# Patient Record
Sex: Female | Born: 1995 | Hispanic: Yes | Marital: Single | Smoking: Never smoker
Health system: Southern US, Community
[De-identification: ages and names within clinical notes are randomized; demographics above are authoritative.]

## PROBLEM LIST (undated history)

## (undated) DIAGNOSIS — K589 Irritable bowel syndrome without diarrhea: Secondary | ICD-10-CM

## (undated) DIAGNOSIS — G43909 Migraine, unspecified, not intractable, without status migrainosus: Secondary | ICD-10-CM

## (undated) DIAGNOSIS — J45909 Unspecified asthma, uncomplicated: Secondary | ICD-10-CM

## (undated) DIAGNOSIS — T753XXA Motion sickness, initial encounter: Secondary | ICD-10-CM

---
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
## 2018-09-11 ENCOUNTER — Other Ambulatory Visit: Payer: Self-pay

## 2018-09-14 ENCOUNTER — Telehealth (INDEPENDENT_AMBULATORY_CARE_PROVIDER_SITE_OTHER): Payer: BC Managed Care – PPO | Admitting: Family Medicine

## 2018-09-14 ENCOUNTER — Encounter: Payer: Self-pay | Admitting: Family Medicine

## 2018-09-14 ENCOUNTER — Other Ambulatory Visit: Payer: Self-pay

## 2018-09-14 VITALS — Ht 60.0 in | Wt 178.0 lb

## 2018-09-14 DIAGNOSIS — Z7689 Persons encountering health services in other specified circumstances: Secondary | ICD-10-CM

## 2018-09-14 DIAGNOSIS — J4520 Mild intermittent asthma, uncomplicated: Secondary | ICD-10-CM | POA: Diagnosis not present

## 2018-09-14 DIAGNOSIS — Z113 Encounter for screening for infections with a predominantly sexual mode of transmission: Secondary | ICD-10-CM | POA: Diagnosis not present

## 2018-09-14 DIAGNOSIS — Z30011 Encounter for initial prescription of contraceptive pills: Secondary | ICD-10-CM | POA: Diagnosis not present

## 2018-09-14 NOTE — Progress Notes (Signed)
CC: new patient, establish care. Pt wants to get back on birth control ( last used nuva ring but would like to discuss another option).  No travel outside the [place] or [place] in the past 3 weeks.

## 2018-09-14 NOTE — Patient Instructions (Signed)
° ° ° °  If you have lab work done today you will be contacted with your lab results within the next 2 weeks.  If you have not heard from us then please contact us. The fastest way to get your results is to register for My Chart. ° ° °IF you received an x-ray today, you will receive an invoice from [facility]. Please contact [facility] at [phone] with questions or concerns regarding your invoice.  ° °IF you received labwork today, you will receive an invoice from [facility]. Please contact [facility] at [phone] with questions or concerns regarding your invoice.  ° °Our billing staff will not be able to assist you with questions regarding bills from these companies. ° °You will be contacted with the lab results as soon as they are available. The fastest way to get your results is to activate your My Chart account. Instructions are located on the last page of this paperwork. If you have not heard from us regarding the results in 2 weeks, please contact this office. °  ° ° ° °

## 2018-09-14 NOTE — Progress Notes (Signed)
Telemedicine Encounter- SOAP NOTE Established Patient  This telephone encounter was conducted with the patient's (or proxy's) verbal consent via audio telecommunications: yes/no: Yes Patient was instructed to have this encounter in a suitably private space; and to only have persons present to whom they give permission to participate. In addition, patient identity was confirmed by use of name plus two identifiers (DOB and address).  I discussed the limitations, risks, security and privacy concerns of performing an evaluation and management service by telephone and the availability of in person appointments. I also discussed with the patient that there may be a patient responsible charge related to this service. The patient expressed understanding and agreed to proceed.  I spent a total of TIME; 0 MIN TO 60 MIN: 20 minutes talking with the patient or their proxy.  CC: contraception, acne  Subjective   Rhonda Zimmerman is a 23 y.o. established patient. Telephone visit today for  HPI  Patient reports that she had a history of migraine in the past They are now controlled . No migraines in a year lmp 08/25/2018 G0P0  Pap smear 21, no abnormal findings, HPV series complete  Asthma She has a history of asthma that was in middle school She denies any recent exacerbations She has needed an albuterol inhaler for sorry.   PMH Asthma - childhood  Family History: Father - high cholesterol Mother - anxiety  Surgery: LCL, PCL bilateral   Social History: working, sexually active, condoms Denies rape, sexual abuse   There are no active problems to display for this patient.   No past medical history on file.  Current Outpatient Medications  Medication Sig Dispense Refill  . APPLE CIDER VINEGAR PO Take 500 mg by mouth. gummy    . CRANBERRY PO Take 1,500 mg by mouth.    . folic acid (FOLVITE) 1 MG tablet Take 1 mg by mouth daily.    . Multiple Vitamin (MULTIVITAMIN) tablet Take  1 tablet by mouth daily.    . Multiple Vitamins-Minerals (VITAMIN D3 COMPLETE PO) Take 2,000 Int'l Units by mouth.     No current facility-administered medications for this visit.     Allergies  Allergen Reactions  . Menactra [Meningococcal A C Y&W-135 Conj] Swelling  . Pneumococcal Vaccines Swelling    Social History   Socioeconomic History  . Marital status: Single    Spouse name: Not on file  . Number of children: Not on file  . Years of education: Not on file  . Highest education level: Not on file  Occupational History  . Not on file  Social Needs  . Financial resource strain: Not on file  . Food insecurity:    Worry: Not on file    Inability: Not on file  . Transportation needs:    Medical: Not on file    Non-medical: Not on file  Tobacco Use  . Smoking status: Never Smoker  . Smokeless tobacco: Never Used  Substance and Sexual Activity  . Alcohol use: Yes    Alcohol/week: 2.0 standard drinks    Types: 1 Shots of liquor, 1 Glasses of wine per week    Comment:  occasionally  . Drug use: Never  . Sexual activity: Not on file  Lifestyle  . Physical activity:    Days per week: Not on file    Minutes per session: Not on file  . Stress: Not on file  Relationships  . Social connections:    Talks on phone: Not on file  Gets together: Not on file    Attends religious service: Not on file    Active member of club or organization: Not on file    Attends meetings of clubs or organizations: Not on file    Relationship status: Not on file  . Intimate partner violence:    Fear of current or ex partner: Not on file    Emotionally abused: Not on file    Physically abused: Not on file    Forced sexual activity: Not on file  Other Topics Concern  . Not on file  Social History Narrative  . Not on file   [facility]  Decreased Interest 0  Down, Depressed, Hopeless 0  PHQ - 2 Score 0    ROS Review of Systems  Constitutional: Negative  for activity change, appetite change, chills and fever.  HENT: Negative for congestion, nosebleeds, trouble swallowing and voice change.   Respiratory: Negative for cough, shortness of breath and wheezing.   Gastrointestinal: Negative for diarrhea, nausea and vomiting.  Genitourinary: Negative for difficulty urinating, dysuria, flank pain and hematuria.  Musculoskeletal: Negative for back pain, joint swelling and neck pain.  Neurological: Negative for dizziness, speech difficulty, light-headedness and numbness.  See HPI. All other review of systems negative.   Objective   Vitals as reported by the patient: Today's Vitals   09/14/18 1052  Weight: 178 lb (80.7 kg)  Height: 5' (1.524 m)    Diagnoses and all orders for this visit:  Establishing care with new doctor, encounter for  Encounter for initial prescription of contraceptive pills- Education given regarding options for contraception, including barrier methods, injectable contraception, IUD placement, oral contraceptives.  -     POCT urine pregnancy; Future -     GC/Chlam[facility]; Future  Screen for STD (sexually transmitted disease) -     POCT urine pregnancy; Future -     GC/Chlamydia probe amp ([place]; Future  Mild intermittent asthma without complication- stable  Avoid triggers, continue albuterol     I discussed the assessment and treatment plan with the patient. The patient was provided an opportunity to ask questions and all were answered. The patient agreed with the plan and demonstrated an understanding of the instructions.   The patient was advised to call back or seek an in-person evaluation if the symptoms worsen or if the condition fails to improve as anticipated.  I provided 20 minutes of non-face-to-face time during this encounter.  Doristine BosworthZoe A Stallings, MD  [facility]

## 2018-09-21 ENCOUNTER — Other Ambulatory Visit: Payer: Self-pay

## 2018-09-21 ENCOUNTER — Ambulatory Visit: Payer: BC Managed Care – PPO

## 2018-09-21 DIAGNOSIS — Z113 Encounter for screening for infections with a predominantly sexual mode of transmission: Secondary | ICD-10-CM

## 2018-09-21 DIAGNOSIS — Z30011 Encounter for initial prescription of contraceptive pills: Secondary | ICD-10-CM

## 2018-09-23 ENCOUNTER — Other Ambulatory Visit: Payer: Self-pay | Admitting: Otolaryngology

## 2018-09-23 DIAGNOSIS — H6983 Other specified disorders of Eustachian tube, bilateral: Secondary | ICD-10-CM

## 2018-09-24 ENCOUNTER — Other Ambulatory Visit: Payer: Self-pay | Admitting: Otolaryngology

## 2018-09-24 ENCOUNTER — Telehealth: Payer: Self-pay | Admitting: Family Medicine

## 2018-09-24 ENCOUNTER — Other Ambulatory Visit (HOSPITAL_COMMUNITY)
Admission: RE | Admit: 2018-09-24 | Discharge: 2018-09-24 | Disposition: A | Discharge status: Home / Self Care | Payer: BC Managed Care – PPO | Source: Ambulatory Visit | Attending: Family Medicine | Admitting: Family Medicine

## 2018-09-24 DIAGNOSIS — Z113 Encounter for screening for infections with a predominantly sexual mode of transmission: Secondary | ICD-10-CM | POA: Insufficient documentation

## 2018-09-24 DIAGNOSIS — Z30011 Encounter for initial prescription of contraceptive pills: Secondary | ICD-10-CM | POA: Diagnosis not present

## 2018-09-24 LAB — POCT URINE PREGNANCY: Preg Test, Ur: NEGATIVE

## 2018-09-24 NOTE — Telephone Encounter (Signed)
chande sent [employer].

## 2018-09-24 NOTE — Telephone Encounter (Signed)
Copied from CRM [id number]. Topic: General - Other >> Sep 24, 2018  2:13 PM Tamela Oddi wrote: Reason for CRM: Patient called to check the status of her urine test and an order for her birth control pills

## 2018-09-25 ENCOUNTER — Ambulatory Visit
Admission: RE | Admit: 2018-09-25 | Discharge: 2018-09-25 | Disposition: A | Discharge status: Home / Self Care | Payer: BC Managed Care – PPO | Source: Ambulatory Visit | Attending: Otolaryngology | Admitting: Otolaryngology

## 2018-09-25 ENCOUNTER — Other Ambulatory Visit: Payer: Self-pay

## 2018-09-25 DIAGNOSIS — Z1159 Encounter for screening for other viral diseases: Secondary | ICD-10-CM | POA: Insufficient documentation

## 2018-09-25 DIAGNOSIS — H6983 Other specified disorders of Eustachian tube, bilateral: Secondary | ICD-10-CM | POA: Diagnosis not present

## 2018-09-25 DIAGNOSIS — Z01812 Encounter for preprocedural laboratory examination: Secondary | ICD-10-CM | POA: Diagnosis not present

## 2018-09-25 LAB — GC/CHLAMYDIA PROBE AMP (~~LOC~~) NOT AT ARMC
Chlamydia: NEGATIVE
Neisseria Gonorrhea: NEGATIVE

## 2018-09-26 LAB — NOVEL CORONAVIRUS, NAA (HOSP ORDER, SEND-OUT TO REF LAB; TAT 18-24 HRS): SARS-CoV-2, NAA: NOT DETECTED

## 2018-09-28 ENCOUNTER — Encounter: Payer: Self-pay | Admitting: *Deleted

## 2018-09-28 ENCOUNTER — Other Ambulatory Visit: Payer: Self-pay

## 2018-09-30 ENCOUNTER — Ambulatory Visit: Payer: BC Managed Care – PPO | Admitting: Anesthesiology

## 2018-09-30 ENCOUNTER — Ambulatory Visit
Admission: RE | Admit: 2018-09-30 | Discharge: 2018-09-30 | Disposition: A | Discharge status: Home / Self Care | Payer: BC Managed Care – PPO | Attending: Otolaryngology | Admitting: Otolaryngology

## 2018-09-30 ENCOUNTER — Encounter
Admission: RE | Disposition: A | Discharge status: Home / Self Care | Payer: Self-pay | Source: Home / Self Care | Attending: Otolaryngology

## 2018-09-30 DIAGNOSIS — J45909 Unspecified asthma, uncomplicated: Secondary | ICD-10-CM | POA: Insufficient documentation

## 2018-09-30 DIAGNOSIS — Y832 Surgical operation with anastomosis, bypass or graft as the cause of abnormal reaction of the patient, or of later complication, without mention of misadventure at the time of the procedure: Secondary | ICD-10-CM | POA: Insufficient documentation

## 2018-09-30 DIAGNOSIS — T859XXA Unspecified complication of internal prosthetic device, implant and graft, initial encounter: Secondary | ICD-10-CM | POA: Insufficient documentation

## 2018-09-30 DIAGNOSIS — H6120 Impacted cerumen, unspecified ear: Secondary | ICD-10-CM | POA: Diagnosis not present

## 2018-09-30 DIAGNOSIS — H6980 Other specified disorders of Eustachian tube, unspecified ear: Secondary | ICD-10-CM | POA: Diagnosis not present

## 2018-09-30 HISTORY — DX: Migraine, unspecified, not intractable, without status migrainosus: G43.909

## 2018-09-30 HISTORY — PX: REMOVAL OF EAR TUBE: SHX6057

## 2018-09-30 HISTORY — PX: MYRINGOTOMY WITH GELFILM: SHX6572

## 2018-09-30 HISTORY — DX: Unspecified asthma, uncomplicated: J45.909

## 2018-09-30 HISTORY — DX: Motion sickness, initial encounter: T75.3XXA

## 2018-09-30 HISTORY — DX: Irritable bowel syndrome, unspecified: K58.9

## 2018-09-30 LAB — POCT PREGNANCY, URINE: Preg Test, Ur: NEGATIVE

## 2018-09-30 SURGERY — EXAM UNDER ANESTHESIA
Anesthesia: General | Site: Ear | Laterality: Bilateral

## 2018-09-30 MED ORDER — PROPOFOL 10 MG/ML IV BOLUS
INTRAVENOUS | Status: DC | PRN
  Administered 2018-09-30: 30 mg via INTRAVENOUS
  Administered 2018-09-30: 100 mg via INTRAVENOUS
  Administered 2018-09-30: 20 mg via INTRAVENOUS
  Administered 2018-09-30: 30 mg via INTRAVENOUS

## 2018-09-30 MED ORDER — ACETAMINOPHEN 325 MG PO TABS: 325.0000 mg | ORAL_TABLET | Freq: Once | ORAL | Status: DC

## 2018-09-30 MED ORDER — MIDAZOLAM HCL 5 MG/5ML IJ SOLN
INTRAMUSCULAR | Status: DC | PRN
  Administered 2018-09-30: 2 mg via INTRAVENOUS

## 2018-09-30 MED ORDER — LACTATED RINGERS IV SOLN
INTRAVENOUS | Status: DC
  Administered 2018-09-30: 07:00:00 via INTRAVENOUS

## 2018-09-30 MED ORDER — SCOPOLAMINE 1 MG/3DAYS TD PT72
1.0000 | MEDICATED_PATCH | Freq: Once | TRANSDERMAL | Status: DC
  Administered 2018-09-30: 1.5 mg via TRANSDERMAL

## 2018-09-30 MED ORDER — ACETAMINOPHEN 160 MG/5ML PO SOLN: 325.0000 mg | Freq: Once | ORAL | Status: DC

## 2018-09-30 MED ORDER — OXYCODONE HCL 5 MG PO TABS
5.0000 mg | ORAL_TABLET | Freq: Once | ORAL | Status: AC
  Administered 2018-09-30: 5 mg via ORAL

## 2018-09-30 MED ORDER — LIDOCAINE HCL (CARDIAC) PF 100 MG/5ML IV SOSY
PREFILLED_SYRINGE | INTRAVENOUS | Status: DC | PRN
  Administered 2018-09-30: 40 mg via INTRATRACHEAL

## 2018-09-30 MED ORDER — GELATIN ADSORBABLE OP FILM
ORAL_FILM | OPHTHALMIC | Status: DC | PRN
  Administered 2018-09-30: 1

## 2018-09-30 MED ORDER — GLYCOPYRROLATE 0.2 MG/ML IJ SOLN
INTRAMUSCULAR | Status: DC | PRN
  Administered 2018-09-30: 0.1 mg via INTRAVENOUS

## 2018-09-30 MED ORDER — ONDANSETRON HCL 4 MG/2ML IJ SOLN
INTRAMUSCULAR | Status: DC | PRN
  Administered 2018-09-30: 4 mg via INTRAVENOUS

## 2018-09-30 SURGICAL SUPPLY — 9 items
BLADE MYR LANCE NRW W/HDL (BLADE) IMPLANT
CANISTER SUCT 1200ML W/VALVE (MISCELLANEOUS) ×3 IMPLANT
COTTONBALL LRG STERILE PKG (GAUZE/BANDAGES/DRESSINGS) IMPLANT
GLOVE BIO SURGEON STRL SZ7.5 (GLOVE) ×5 IMPLANT
KIT TURNOVER KIT A (KITS) ×3 IMPLANT
STRAP BODY AND KNEE 60X3 (MISCELLANEOUS) ×3 IMPLANT
TOWEL OR 17X26 4PK STRL BLUE (TOWEL DISPOSABLE) ×3 IMPLANT
TUBING CONN 6MMX3.1M (TUBING) ×2
TUBING SUCTION CONN 0.25 STRL (TUBING) ×1 IMPLANT

## 2018-09-30 NOTE — Discharge Instructions (Signed)
General Anesthesia, Adult, Care After  This sheet gives you information about how to care for yourself after your procedure. Your health care provider may also give you more specific instructions. If you have problems or questions, contact your health care provider.  What can I expect after the procedure?  After the procedure, the following side effects are common:  Pain or discomfort at the IV site.  Nausea.  Vomiting.  Sore throat.  Trouble concentrating.  Feeling cold or chills.  Weak or tired.  Sleepiness and fatigue.  Soreness and body aches. These side effects can affect parts of the body that were not involved in surgery.  Follow these instructions at home:    For at least 24 hours after the procedure:  Have a responsible adult stay with you. It is important to have someone help care for you until you are awake and alert.  Rest as needed.  Do not:  Participate in activities in which you could fall or become injured.  Drive.  Use heavy machinery.  Drink alcohol.  Take sleeping pills or medicines that cause drowsiness.  Make important decisions or sign legal documents.  Take care of children on your own.  Eating and drinking  Follow any instructions from your health care provider about eating or drinking restrictions.  When you feel hungry, start by eating small amounts of foods that are soft and easy to digest (bland), such as toast. Gradually return to your regular diet.  Drink enough fluid to keep your urine pale yellow.  If you vomit, rehydrate by drinking water, juice, or clear broth.  General instructions  If you have sleep apnea, surgery and certain medicines can increase your risk for breathing problems. Follow instructions from your health care provider about wearing your sleep device:  Anytime you are sleeping, including during daytime naps.  While taking prescription pain medicines, sleeping medicines, or medicines that make you drowsy.  Return to your normal activities as told by your health care  provider. Ask your health care provider what activities are safe for you.  Take over-the-counter and prescription medicines only as told by your health care provider.  If you smoke, do not smoke without supervision.  Keep all follow-up visits as told by your health care provider. This is important.  Contact a health care provider if:  You have nausea or vomiting that does not get better with medicine.  You cannot eat or drink without vomiting.  You have pain that does not get better with medicine.  You are unable to pass urine.  You develop a skin rash.  You have a fever.  You have redness around your IV site that gets worse.  Get help right away if:  You have difficulty breathing.  You have chest pain.  You have blood in your urine or stool, or you vomit blood.  Summary  After the procedure, it is common to have a sore throat or nausea. It is also common to feel tired.  Have a responsible adult stay with you for the first 24 hours after general anesthesia. It is important to have someone help care for you until you are awake and alert.  When you feel hungry, start by eating small amounts of foods that are soft and easy to digest (bland), such as toast. Gradually return to your regular diet.  Drink enough fluid to keep your urine pale yellow.  Return to your normal activities as told by your health care provider. Ask your health care   provider what activities are safe for you.  This information is not intended to replace advice given to you by your health care provider. Make sure you discuss any questions you have with your health care provider.  Document Released: 08/12/2000 Document Revised: 12/20/2016 Document Reviewed: 12/20/2016  Elsevier Interactive Patient Education  2019 [employer]

## 2018-09-30 NOTE — Anesthesia Postprocedure Evaluation (Signed)
Anesthesia Post Note  Patient: Rhonda Zimmerman  Procedure(s) Performed: EXAM UNDER ANESTHESIA (Bilateral Ear) REMOVAL OF EAR TUBE (Bilateral Ear) MYRINGOTOMY WITH GELFILM (Bilateral Ear)  Patient location during evaluation: PACU Anesthesia Type: General Level of consciousness: awake and alert and oriented Pain management: satisfactory to patient Vital Signs Assessment: post-procedure vital signs reviewed and stable Respiratory status: spontaneous breathing, nonlabored ventilation and respiratory function stable Cardiovascular status: blood pressure returned to baseline and stable Postop Assessment: Adequate PO intake and No signs of nausea or vomiting Anesthetic complications: no    Cherly [place]

## 2018-09-30 NOTE — Transfer of Care (Signed)
Immediate Anesthesia Transfer of Care Note  Patient: Rhonda Zimmerman  Procedure(s) Performed: EXAM UNDER ANESTHESIA (Bilateral Ear) REMOVAL OF EAR TUBE (Bilateral Ear) MYRINGOTOMY WITH GELFILM (Bilateral Ear)  Patient Location: PACU  Anesthesia Type: General  Level of Consciousness: awake, alert  and patient cooperative  Airway and Oxygen Therapy: Patient Spontanous Breathing and Patient connected to supplemental oxygen  Post-op Assessment: Post-op Vital signs reviewed, Patient's Cardiovascular Status Stable, Respiratory Function Stable, Patent Airway and No signs of Nausea or vomiting  Post-op Vital Signs: Reviewed and stable  Complications: No apparent anesthesia complications

## 2018-09-30 NOTE — H&P (Signed)
..  History and Physical paper copy reviewed and updated date of procedure and will be scanned into system.  Patient seen and examined.  

## 2018-09-30 NOTE — Anesthesia Procedure Notes (Signed)
Procedure Name: General with mask airway Performed by: Drexler Maland, CRNA Pre-anesthesia Checklist: Patient identified, Patient being monitored, Emergency Drugs available, Timeout performed and Suction available Patient Re-evaluated:Patient Re-evaluated prior to induction Oxygen Delivery Method: Circle system utilized Preoxygenation: Pre-oxygenation with 100% oxygen Induction Type: Combination inhalational/ intravenous induction Ventilation: Mask ventilation without difficulty Dental Injury: Teeth and Oropharynx as per pre-operative assessment        

## 2018-09-30 NOTE — Anesthesia Preprocedure Evaluation (Signed)
Anesthesia Evaluation  Patient identified by MRN, date of birth, ID band Patient awake    Reviewed: Allergy & Precautions, H&P , NPO status , Patient's Chart, lab work & pertinent test results  Airway Mallampati: II  TM Distance: >3 FB Neck ROM: full    Dental no notable dental hx.    Pulmonary asthma ,    Pulmonary exam normal breath sounds clear to auscultation       Cardiovascular Normal cardiovascular exam Rhythm:regular Rate:Normal     Neuro/Psych    GI/Hepatic   Endo/Other    Renal/GU      Musculoskeletal   Abdominal   Peds  Hematology   Anesthesia Other Findings   Reproductive/Obstetrics                             Anesthesia Physical Anesthesia Plan  ASA: II  Anesthesia Plan: General   Post-op Pain Management:    Induction:   PONV Risk Score and Plan: 3 and Scopolamine patch - Pre-op, Propofol infusion, Midazolam and Treatment may vary due to age or medical condition  Airway Management Planned:   Additional Equipment:   Intra-op Plan:   Post-operative Plan:   Informed Consent: I have reviewed the patients History and Physical, chart, labs and discussed the procedure including the risks, benefits and alternatives for the proposed anesthesia with the patient or authorized representative who has indicated his/her understanding and acceptance.       Plan Discussed with: CRNA  Anesthesia Plan Comments:         Anesthesia Quick Evaluation

## 2018-09-30 NOTE — Op Note (Signed)
..  09/30/2018  7:53 AM    Charlton Amor  [id number]   Pre-Op Dx:  Rhonda Zimmerman Dysfunction Cerumen Impaction  Post-op Dx: Rhonda Zimmerman Tube Dysfunction Cerumen Impaction  Proc:   1)  Examination under anesthesia with tube removal  2)  Bilateral Gel-film myringoplasty  Surg: Rhonda Zimmerman Rhonda Zimmerman  Anes:  General by mask  EBL:  None  Comp:  None  Findings:  Retained Butterfly tubes with large amount of wax at base and plugged tubes.  Tubes removed and moderate sized central perforation bilaterally noted.  Gel-film myringoplasty performed.  Procedure: With the patient in a comfortable supine position, general mask anesthesia was administered.  At an appropriate level, microscope and speculum were used to examine and clean the RIGHT ear canal.  A large amount of wax surrounding a plugged Butterfly tube was noted.  This was gently loosened with a Pollyann Kennedy pick.  The tube and wax conglomeration was then gently removed with alligator forceps.  This demonstrated a moderate sized central perforation with small armound of granulation tissue at the superior and lateral aspect.  Under microscopic evaluation, this perforation was rimmed with a Pollyann Kennedy pick to freshen the edges.  The edge of the perforation was removed with forceps.  A gel-film patch was fashioned and placed over the perforation and held in place with a dot of blood from rimmed perforation.  This side was completed.  After completing the RIGHT side, the LEFT side was done in identical fashion with similar findings of moderate central perforation.    Following this  The patient was returned to anesthesia, awakened, and transferred to recovery in stable condition.  Dispo:  PACU to home  Plan: Dry ear precautions.  Recheck my office three to four weeks.   Rhonda Zimmerman [name] 7:53 AM 09/30/2018

## 2018-10-01 ENCOUNTER — Encounter: Payer: Self-pay | Admitting: Otolaryngology

## 2019-03-31 ENCOUNTER — Other Ambulatory Visit (HOSPITAL_COMMUNITY)
Admission: RE | Admit: 2019-03-31 | Discharge: 2019-03-31 | Disposition: A | Discharge status: Home / Self Care | Payer: BC Managed Care – PPO | Source: Ambulatory Visit | Attending: Nurse Practitioner | Admitting: Nurse Practitioner

## 2019-03-31 DIAGNOSIS — Z124 Encounter for screening for malignant neoplasm of cervix: Secondary | ICD-10-CM | POA: Insufficient documentation

## 2019-06-04 ENCOUNTER — Other Ambulatory Visit: Payer: Self-pay | Admitting: Gastroenterology

## 2019-06-04 DIAGNOSIS — R1011 Right upper quadrant pain: Secondary | ICD-10-CM

## 2019-06-04 DIAGNOSIS — R1010 Upper abdominal pain, unspecified: Secondary | ICD-10-CM

## 2019-06-04 DIAGNOSIS — R110 Nausea: Secondary | ICD-10-CM

## 2019-06-16 ENCOUNTER — Ambulatory Visit
Admission: RE | Admit: 2019-06-16 | Discharge: 2019-06-16 | Disposition: A | Discharge status: Home / Self Care | Payer: BC Managed Care – PPO | Source: Ambulatory Visit | Attending: Gastroenterology | Admitting: Gastroenterology

## 2019-06-16 DIAGNOSIS — R1010 Upper abdominal pain, unspecified: Secondary | ICD-10-CM

## 2019-06-16 DIAGNOSIS — R110 Nausea: Secondary | ICD-10-CM

## 2019-06-16 DIAGNOSIS — R1011 Right upper quadrant pain: Secondary | ICD-10-CM

## 2019-10-15 ENCOUNTER — Other Ambulatory Visit: Payer: Self-pay

## 2019-10-15 ENCOUNTER — Encounter (HOSPITAL_COMMUNITY): Payer: Self-pay | Admitting: Emergency Medicine

## 2019-10-15 ENCOUNTER — Emergency Department (HOSPITAL_COMMUNITY): Payer: BC Managed Care – PPO

## 2019-10-15 ENCOUNTER — Emergency Department (HOSPITAL_COMMUNITY)
Admission: EM | Admit: 2019-10-15 | Discharge: 2019-10-15 | Disposition: A | Discharge status: Home / Self Care | Payer: BC Managed Care – PPO | Attending: Emergency Medicine | Admitting: Emergency Medicine

## 2019-10-15 DIAGNOSIS — R1010 Upper abdominal pain, unspecified: Secondary | ICD-10-CM

## 2019-10-15 DIAGNOSIS — J45909 Unspecified asthma, uncomplicated: Secondary | ICD-10-CM | POA: Diagnosis not present

## 2019-10-15 DIAGNOSIS — R509 Fever, unspecified: Secondary | ICD-10-CM | POA: Diagnosis not present

## 2019-10-15 DIAGNOSIS — Z79899 Other long term (current) drug therapy: Secondary | ICD-10-CM | POA: Diagnosis not present

## 2019-10-15 DIAGNOSIS — R1013 Epigastric pain: Secondary | ICD-10-CM | POA: Insufficient documentation

## 2019-10-15 LAB — URINALYSIS, ROUTINE W REFLEX MICROSCOPIC
Bilirubin Urine: NEGATIVE
Glucose, UA: NEGATIVE mg/dL
Ketones, ur: 20 mg/dL — AB
Leukocytes,Ua: NEGATIVE
Nitrite: NEGATIVE
Protein, ur: NEGATIVE mg/dL
Specific Gravity, Urine: 1.006 (ref 1.005–1.030)
pH: 6 (ref 5.0–8.0)

## 2019-10-15 LAB — COMPREHENSIVE METABOLIC PANEL
ALT: 35 U/L (ref 0–44)
AST: 25 U/L (ref 15–41)
Albumin: 4.4 g/dL (ref 3.5–5.0)
Alkaline Phosphatase: 58 U/L (ref 38–126)
Anion gap: 12 (ref 5–15)
BUN: 7 mg/dL (ref 6–20)
CO2: 22 mmol/L (ref 22–32)
Calcium: 9.1 mg/dL (ref 8.9–10.3)
Chloride: 104 mmol/L (ref 98–111)
Creatinine, Ser: 0.72 mg/dL (ref 0.44–1.00)
GFR calc Af Amer: >60 mL/min (ref >60)
GFR calc non Af Amer: >60 mL/min (ref >60)
Glucose, Bld: 86 mg/dL (ref 70–99)
Potassium: 3.8 mmol/L (ref 3.5–5.1)
Sodium: 138 mmol/L (ref 135–145)
Total Bilirubin: 0.9 mg/dL (ref 0.3–1.2)
Total Protein: 8.2 g/dL — ABNORMAL HIGH (ref 6.5–8.1)

## 2019-10-15 LAB — I-STAT BETA HCG BLOOD, ED (MC, WL, AP ONLY): I-stat hCG, quantitative: <5 m[IU]/mL (ref <5)

## 2019-10-15 LAB — LIPASE, BLOOD: Lipase: 21 U/L (ref 11–51)

## 2019-10-15 LAB — CBC
HCT: 43.5 % (ref 36.0–46.0)
Hemoglobin: 14 g/dL (ref 12.0–15.0)
MCH: 28.2 pg (ref 26.0–34.0)
MCHC: 32.2 g/dL (ref 30.0–36.0)
MCV: 87.5 fL (ref 80.0–100.0)
Platelets: 233 10*3/uL (ref 150–400)
RBC: 4.97 MIL/uL (ref 3.87–5.11)
RDW: 13.7 % (ref 11.5–15.5)
WBC: 7.7 10*3/uL (ref 4.0–10.5)
nRBC: 0 % (ref 0.0–0.2)

## 2019-10-15 MED ORDER — HYDROMORPHONE HCL 1 MG/ML IJ SOLN
1.0000 mg | Freq: Once | INTRAMUSCULAR | Status: AC
  Administered 2019-10-15: 1 mg via INTRAVENOUS
  Filled 2019-10-15: qty 1

## 2019-10-15 MED ORDER — IOHEXOL 300 MG/ML  SOLN
100.0000 mL | Freq: Once | INTRAMUSCULAR | Status: AC | PRN
  Administered 2019-10-15: 100 mL via INTRAVENOUS

## 2019-10-15 MED ORDER — ALBUTEROL SULFATE HFA 108 (90 BASE) MCG/ACT IN AERS
2.0000 | INHALATION_SPRAY | RESPIRATORY_TRACT | Status: DC | PRN
  Administered 2019-10-15: 2 via RESPIRATORY_TRACT
  Filled 2019-10-15: qty 6.7

## 2019-10-15 MED ORDER — SODIUM CHLORIDE 0.9% FLUSH: 3.0000 mL | Freq: Once | INTRAVENOUS | Status: DC

## 2019-10-15 MED ORDER — SODIUM CHLORIDE 0.9 % IV BOLUS
1000.0000 mL | Freq: Once | INTRAVENOUS | Status: AC
  Administered 2019-10-15: 1000 mL via INTRAVENOUS

## 2019-10-15 MED ORDER — ONDANSETRON HCL 4 MG/2ML IJ SOLN
4.0000 mg | Freq: Once | INTRAMUSCULAR | Status: AC
  Administered 2019-10-15: 4 mg via INTRAVENOUS
  Filled 2019-10-15: qty 2

## 2019-10-15 MED ORDER — ALUM & MAG HYDROXIDE-SIMETH 200-200-20 MG/5ML PO SUSP
30.0000 mL | Freq: Once | ORAL | Status: AC
  Administered 2019-10-15: 30 mL via ORAL
  Filled 2019-10-15: qty 30

## 2019-10-15 MED ORDER — ACETAMINOPHEN 500 MG PO TABS
1000.0000 mg | ORAL_TABLET | Freq: Once | ORAL | Status: AC
  Administered 2019-10-15: 1000 mg via ORAL
  Filled 2019-10-15: qty 2

## 2019-10-15 MED ORDER — ACETAMINOPHEN 325 MG PO TABS
650.0000 mg | ORAL_TABLET | Freq: Once | ORAL | Status: AC | PRN
  Administered 2019-10-15: 650 mg via ORAL
  Filled 2019-10-15: qty 2

## 2019-10-15 MED ORDER — FAMOTIDINE 20 MG PO TABS
20.0000 mg | ORAL_TABLET | Freq: Once | ORAL | Status: AC
  Administered 2019-10-15: 20 mg via ORAL
  Filled 2019-10-15: qty 1

## 2019-10-15 MED ORDER — PANTOPRAZOLE SODIUM 40 MG PO TBEC
40.0000 mg | DELAYED_RELEASE_TABLET | Freq: Every day | ORAL | 0 refills | Status: AC
Start: 2019-10-15 — End: ?

## 2019-10-15 NOTE — ED Notes (Signed)
Waiting for pts fluids to finish then d/c. 

## 2019-10-15 NOTE — ED Triage Notes (Signed)
Sent form [facility] for abd pains that radiates to her back since Tuesday. Reports worse with eating. Had diarrhea Tuesday. Had dark urine since.

## 2019-10-15 NOTE — Discharge Instructions (Addendum)
It was our pleasure to provide your ER care today - we hope that you feel better.  Drink plenty of fluids. Take protonix (acid blocker medication). Take acetaminophen as need.   Although your ultrasound does not show gallstones, it is possible that gallbladder disease/dysfunction is causing your symptoms - follow up with general surgeon in the coming week - call office to arrange appointment.   Return to ER if worse, new symptoms, fevers, worsening or severe pain, persistent vomiting, or other concern.  You were given pain medication in the ER - no driving for the next 8 hours.

## 2019-10-15 NOTE — ED Provider Notes (Signed)
[facility]-EMERGENCY DEPT Provider Note   CSN: [id number] Arrival date & time: 10/15/19  1251     History Chief Complaint  Patient presents with  . Abdominal Pain  . Fever    Rhonda Zimmerman is a 24 y.o. female.  Patient c/o epigastric/upper abd pain for the past 3 days. Symptoms acute onset, at rest, dull, mod-severe, worse w eating, occasionally radiating to back and shoulder. No hx same pain. No hx gallstones, pud, or pancreatitis. +mom had gallstones. Low grade fever in ED, no chills/sweats. +decreased appetite and poor po intake in past few days. No dysuria or gu c/o. No lower abd or pelvic pain. lnmp 2 weeks ago. No vaginal discharge or bleeding.   The history is provided by the patient.  Abdominal Pain Associated symptoms: fever and nausea   Associated symptoms: no chest pain, no chills, no constipation, no cough, no diarrhea, no dysuria, no sore throat, no vaginal bleeding, no vaginal discharge and no vomiting   Fever Associated symptoms: nausea   Associated symptoms: no chest pain, no chills, no confusion, no cough, no diarrhea, no dysuria, no headaches, no rash, no sore throat and no vomiting   Shortness of Breath Associated symptoms: abdominal pain and fever   Associated symptoms: no chest pain, no cough, no headaches, no neck pain, no rash, no sore throat and no vomiting        Past Medical History:  Diagnosis Date  . Asthma    in past, no issues for several yrs  . IBS (irritable bowel syndrome)   . Migraine headache    none in over 1 yr  . Motion sickness    all vehicles    There are no problems to display for this patient.   Past Surgical History:  Procedure Laterality Date  . KNEE SURGERY Right 2011  . MYRINGOTOMY WITH GELFILM Bilateral 09/30/2018   Procedure: MYRINGOTOMY WITH GELFILM;  Surgeon: Bud Face, MD;  Location: [facility];  Service: ENT;  Laterality: Bilateral;  . REMOVAL OF EAR TUBE Bilateral  09/30/2018   Procedure: REMOVAL OF EAR TUBE;  Surgeon: Bud Face, MD;  Location: [facility];  Service: ENT;  Laterality: Bilateral;  . TYMPANOSTOMY TUBE PLACEMENT Bilateral 2001     OB History   No obstetric history on file.     History reviewed. No pertinent family history.  Social History   Tobacco Use  . Smoking status: Never Smoker  . Smokeless tobacco: Never Used  Substance Use Topics  . Alcohol use: Yes    Alcohol/week: 2.0 standard drinks    Types: 1 Glasses of wine, 1 Shots of liquor per week    Comment:  occasionally  - 1x/mo  . Drug use: Never    Home Medications Prior to Admission medications   Medication Sig Start Date End Date Taking? Authorizing Provider  APPLE CIDER VINEGAR PO Take 500 mg by mouth. gummy    [provider]  Cholecalciferol (VITAMIN D3 PO) Take 2,000 Int'l Units by mouth daily.    [provider]  CRANBERRY PO Take 1,500 mg by mouth.    [provider]  folic acid (FOLVITE) 1 MG tablet Take 1 mg by mouth daily.    [provider]    Allergies    Menactra [meningococcal a c y&w-135 conj], Pneumococcal vaccines, and Lactose intolerance (gi)  Review of Systems   Review of Systems  Constitutional: Positive for fever. Negative for chills.  HENT: Negative for sore  throat.   Eyes: Negative for redness.  Respiratory: Negative for cough.   Cardiovascular: Negative for chest pain.  Gastrointestinal: Positive for abdominal pain and nausea. Negative for constipation, diarrhea and vomiting.  Endocrine: Negative for polyuria.  Genitourinary: Negative for dysuria, flank pain, vaginal bleeding and vaginal discharge.  Musculoskeletal: Negative for neck pain.  Skin: Negative for rash.  Neurological: Negative for headaches.  Hematological: Does not bruise/bleed easily.  Psychiatric/Behavioral: Negative for confusion.    Physical Exam Updated Vital Signs BP (!) 147/97   Pulse (!) 138   Temp (!)  100.5 F (38.1 C) (Oral)   Resp 18   SpO2 100%   Physical Exam Vitals and nursing note reviewed.  Constitutional:      Appearance: Normal appearance. She is well-developed.  HENT:     Head: Atraumatic.     Nose: Nose normal.     Mouth/Throat:     Mouth: Mucous membranes are moist.  Eyes:     General: No scleral icterus.    Conjunctiva/sclera: Conjunctivae normal.  Neck:     Trachea: No tracheal deviation.  Cardiovascular:     Rate and Rhythm: Regular rhythm. Tachycardia present.     Pulses: Normal pulses.     Heart sounds: Normal heart sounds. No murmur. No friction rub. No gallop.   Pulmonary:     Effort: Pulmonary effort is normal. No respiratory distress.     Breath sounds: Normal breath sounds.  Abdominal:     General: Bowel sounds are normal. There is no distension.     Palpations: Abdomen is soft. There is no mass.     Tenderness: There is abdominal tenderness. There is no guarding or rebound.     Hernia: No hernia is present.     Comments: Epigastric and RUQ tenderness.   Genitourinary:    Comments: No cva tenderness.  Musculoskeletal:        General: No swelling.     Cervical back: Normal range of motion and neck supple. No rigidity. No muscular tenderness.  Skin:    General: Skin is warm and dry.     Findings: No rash.  Neurological:     Mental Status: She is alert.     Comments: Alert, speech normal.   Psychiatric:        Mood and Affect: Mood normal.     ED Results / Procedures / Treatments   Labs (all labs ordered are listed, but only abnormal results are displayed) Results for orders placed or performed during the hospital encounter of 10/15/19  Lipase, blood  Result Value Ref Range   Lipase 21 11 - 51 U/L  Comprehensive metabolic panel  Result Value Ref Range   Sodium 138 135 - 145 mmol/L   Potassium 3.8 3.5 - 5.1 mmol/L   Chloride 104 98 - 111 mmol/L   CO2 22 22 - 32 mmol/L   Glucose, Bld 86 70 - 99 mg/dL   BUN 7 6 - 20 mg/dL   Creatinine,  Ser 2.37 0.44 - 1.00 mg/dL   Calcium 9.1 8.9 - 62.8 mg/dL   Total Protein 8.2 (H) 6.5 - 8.1 g/dL   Albumin 4.4 3.5 - 5.0 g/dL   AST 25 15 - 41 U/L   ALT 35 0 - 44 U/L   Alkaline Phosphatase 58 38 - 126 U/L   Total Bilirubin 0.9 0.3 - 1.2 mg/dL   GFR calc non Af Amer >60 >60 mL/min   GFR calc Af Amer >60 >60 mL/min  Anion gap 12 5 - 15  CBC  Result Value Ref Range   WBC 7.7 4.0 - 10.5 K/uL   RBC 4.97 3.87 - 5.11 MIL/uL   Hemoglobin 14.0 12.0 - 15.0 g/dL   HCT 03.7 04.8 - 88.9 %   MCV 87.5 80.0 - 100.0 fL   MCH 28.2 26.0 - 34.0 pg   MCHC 32.2 30.0 - 36.0 g/dL   RDW 16.9 45.0 - 38.8 %   Platelets 233 150 - 400 K/uL   nRBC 0.0 0.0 - 0.2 %  Urinalysis, Routine w reflex microscopic  Result Value Ref Range   Color, Urine YELLOW YELLOW   APPearance CLEAR CLEAR   Specific Gravity, Urine 1.006 1.005 - 1.030   pH 6.0 5.0 - 8.0   Glucose, UA NEGATIVE NEGATIVE mg/dL   Hgb urine dipstick SMALL (A) NEGATIVE   Bilirubin Urine NEGATIVE NEGATIVE   Ketones, ur 20 (A) NEGATIVE mg/dL   Protein, ur NEGATIVE NEGATIVE mg/dL   Nitrite NEGATIVE NEGATIVE   Leukocytes,Ua NEGATIVE NEGATIVE   RBC / HPF 0-5 0 - 5 RBC/hpf   WBC, UA 0-5 0 - 5 WBC/hpf   Bacteria, UA RARE (A) NONE SEEN   Squamous Epithelial / LPF 0-5 0 - 5   Mucus PRESENT   I-Stat beta hCG blood, ED  Result Value Ref Range   I-stat hCG, quantitative <5.0 <5 mIU/mL   Comment 3           DG Chest 2 View  Result Date: 10/15/2019 CLINICAL DATA:  Shortness of breath since last night. Back and bilateral shoulder pain radiating from the abdomen. EXAM: CHEST - 2 VIEW COMPARISON:  Report dated 03/30/1997. FINDINGS: The heart size and mediastinal contours are within normal limits. Both lungs are clear. The visualized skeletal structures are unremarkable. IMPRESSION: Normal examination. Electronically Signed   By: Beckie Salts M.D.   On: 10/15/2019 14:14    EKG EKG Interpretation  Date/Time:  Friday Oct 15 2019 13:05:47 EDT Ventricular  Rate:  128 PR Interval:    QRS Duration: 78 QT Interval:  297 QTC Calculation: 434 R Axis:   53 Text Interpretation: Sinus tachycardia Nonspecific ST abnormality No previous tracing Confirmed by Cathren Laine ([id number]) on 10/15/2019 6:06:01 PM   Radiology DG Chest 2 View  Result Date: 10/15/2019 CLINICAL DATA:  Shortness of breath since last night. Back and bilateral shoulder pain radiating from the abdomen. EXAM: CHEST - 2 VIEW COMPARISON:  Report dated 03/30/1997. FINDINGS: The heart size and mediastinal contours are within normal limits. Both lungs are clear. The visualized skeletal structures are unremarkable. IMPRESSION: Normal examination. Electronically Signed   By: Beckie Salts M.D.   On: 10/15/2019 14:14   CT ABDOMEN PELVIS W CONTRAST  Result Date: 10/15/2019 CLINICAL DATA:  24 year old female with abdominal pain. EXAM: CT ABDOMEN AND PELVIS WITH CONTRAST TECHNIQUE: Multidetector CT imaging of the abdomen and pelvis was performed using the standard protocol following bolus administration of intravenous contrast. CONTRAST:  OMNIPAQUE IOHEXOL 300 MG/ML  SOLN COMPARISON:  Abdominal ultrasound dated 10/15/2019. FINDINGS: Lower chest: The visualized lung bases are clear. No intra-abdominal free air. Small free fluid in the pelvis. Hepatobiliary: No focal liver abnormality is seen. No gallstones, gallbladder wall thickening, or biliary dilatation. Pancreas: Unremarkable. No pancreatic ductal dilatation or surrounding inflammatory changes. Spleen: Normal in size without focal abnormality. Adrenals/Urinary Tract: The adrenal glands, kidneys, visualized ureters, and urinary bladder appear unremarkable. Stomach/Bowel: There is no bowel obstruction or active inflammation. The appendix is  normal. Vascular/Lymphatic: The abdominal aorta and IVC unremarkable. No portal venous gas. There is no adenopathy. Reproductive: The uterus is anteverted and grossly unremarkable. A pessary is noted. No adnexal  masses. Other: None Musculoskeletal: No acute or significant osseous findings. IMPRESSION: No acute intra-abdominal or pelvic pathology. No bowel obstruction. Normal appendix. Electronically Signed   By: Anner Crete M.D.   On: 10/15/2019 21:00   US Abdomen Limited  Result Date: 10/15/2019 CLINICAL DATA:  Pain EXAM: ULTRASOUND ABDOMEN LIMITED RIGHT UPPER QUADRANT COMPARISON:  Ultrasound dated June 16, 2019 FINDINGS: Gallbladder: No gallstones or wall thickening visualized. No sonographic Murphy sign noted by sonographer. Common bile duct: Diameter: 5 mm Liver: No focal lesion identified. Within normal limits in parenchymal echogenicity. Portal vein is patent on color Doppler imaging with normal direction of blood flow towards the liver. Other: None. IMPRESSION: No evidence for cholelithiasis or acute cholecystitis. Electronically Signed   By: Constance Holster M.D.   On: 10/15/2019 19:48    Procedures Procedures (including critical care time)  Medications Ordered in ED Medications  sodium chloride flush (NS) 0.9 % injection 3 mL (has no administration in time range)  acetaminophen (TYLENOL) tablet 650 mg (650 mg Oral Given 10/15/19 1437)    ED Course  I have reviewed the triage vital signs and the nursing notes.  Pertinent labs & imaging results that were available during my care of the patient were reviewed by me and considered in my medical decision making (see chart for details).    MDM Rules/Calculators/A&P                      Iv ns bolus. Stat labs. U/s ordered.   Dilaudid iv. zofran iv.   Reviewed nursing notes and prior charts for additional history.   Labs reviewed/interpreted by me - wbc normal. u preg neg.  CXR reviewed/interpreted by me - no pna.   Additional labs reviewed/interpreted by me - lipase normal.   U/s reviewed/interpreted by me - no gallstones.   Symptoms persist. +upper abd tenderness. Will get ct as u/s neg acute.  CT reviewed/interpreted by  me - no acute abd infection.   pepcid po. Acetaminophen po. maalox po.   Trial of po fluids.  No nv. Pt comfortable appearing.   Pt currently appears stable for d/c.   Rec pcp f/u, gen surg f/u.  Return precautions provided.      Final Clinical Impression(s) / ED Diagnoses Final diagnoses:  None    Rx / DC Orders ED Discharge Orders    None       Lajean Saver, MD 10/16/19 [phone]

## 2021-04-12 IMAGING — CT CT ABD-PELV W/ CM
2 of 4 series · 17 of 46 positions shown, 19 images · IV contrast (omnipaque)
Comparison: Abdominal ultrasound dated 10/15/2019.

CLINICAL DATA: 24-year-old female with abdominal pain.

EXAM:
CT ABDOMEN AND PELVIS WITH CONTRAST
TECHNIQUE: Multidetector CT imaging of the abdomen and pelvis was performed
using the standard protocol following bolus administration of
intravenous contrast.
CONTRAST:  100mL OMNIPAQUE IOHEXOL 300 MG/ML  SOLN

[Series 2: axial st · axial · 0.71mm/px · z∈[+1016,+1436]mm · 14 of 96 slices shown, 16 images]
[im 6/96  soft-tissue]
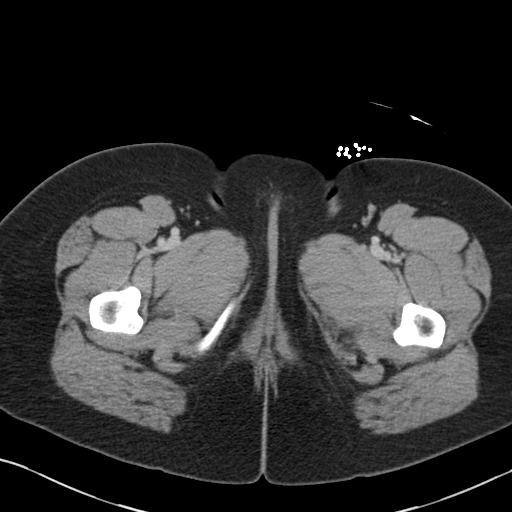
[im 6/96  bone]
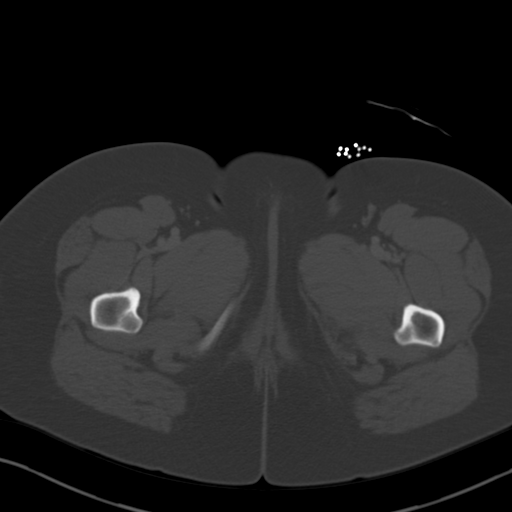
[im 11/96  soft-tissue]
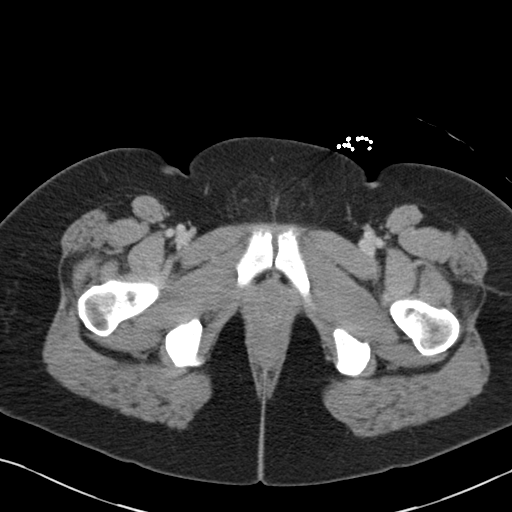
[im 22/96  soft-tissue]
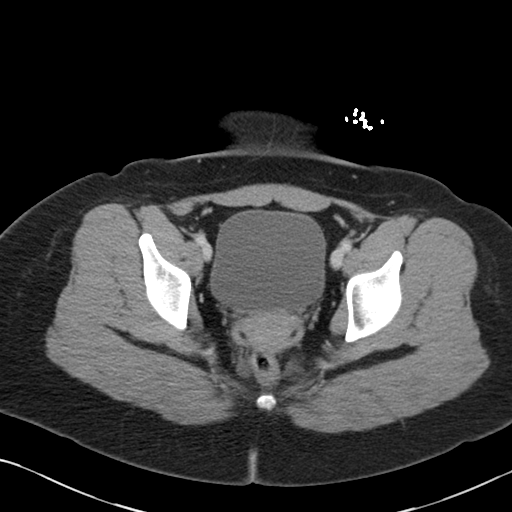
[im 27/96  soft-tissue]
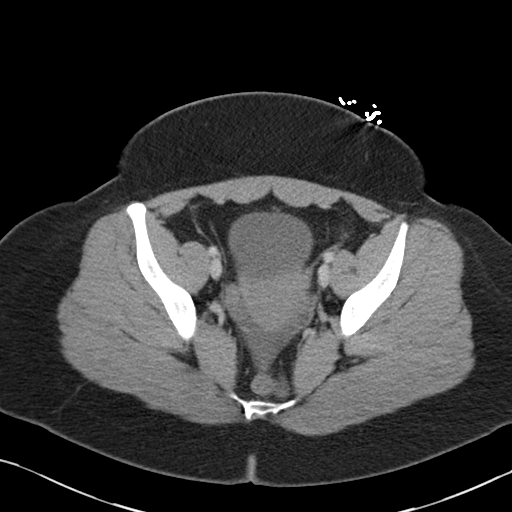
[im 32/96  soft-tissue]
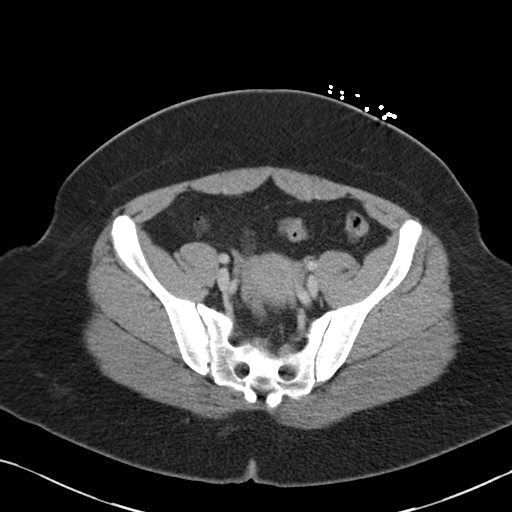
[im 37/96  soft-tissue]
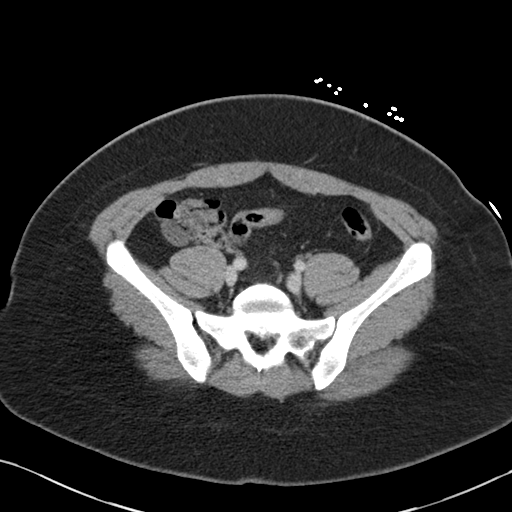
[im 43/96  soft-tissue]
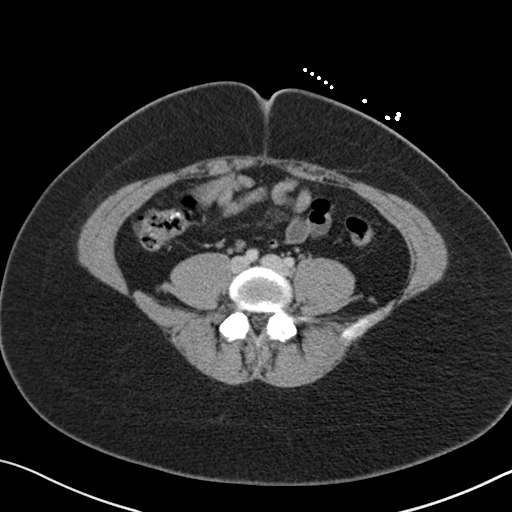
[im 53/96  soft-tissue]
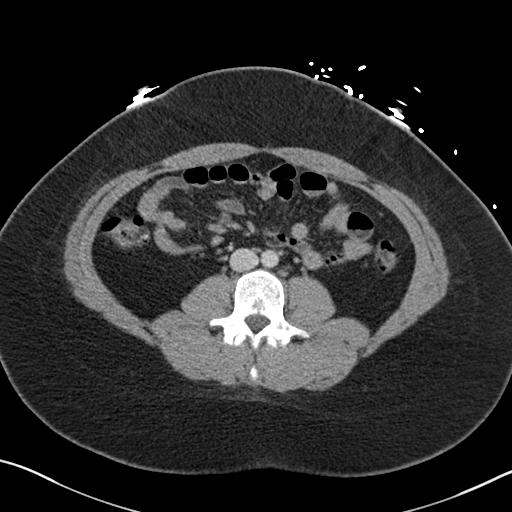
[im 59/96  soft-tissue]
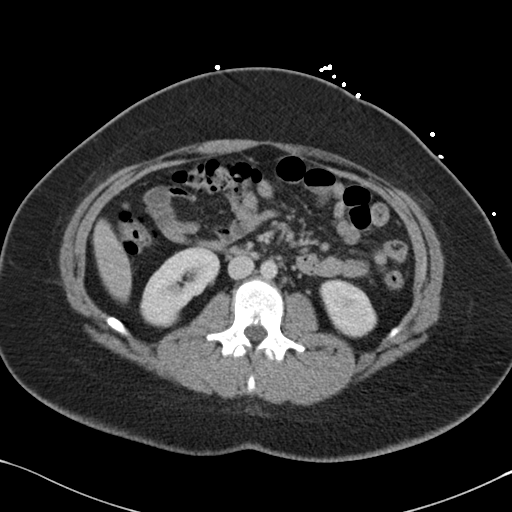
[im 59/96  bone]
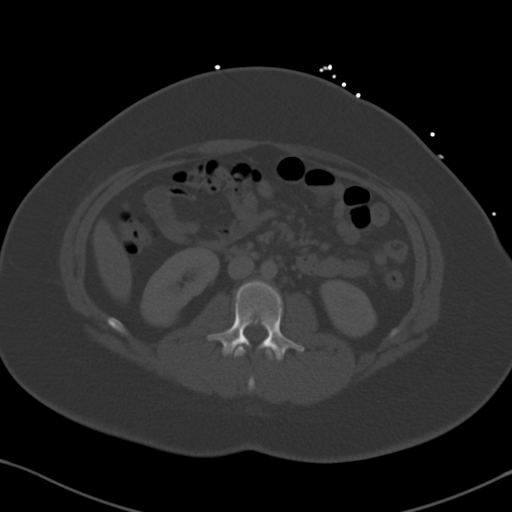
[im 64/96  soft-tissue]
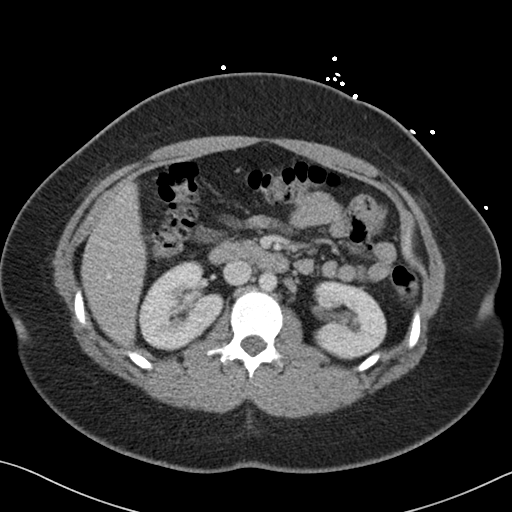
[im 69/96  soft-tissue]
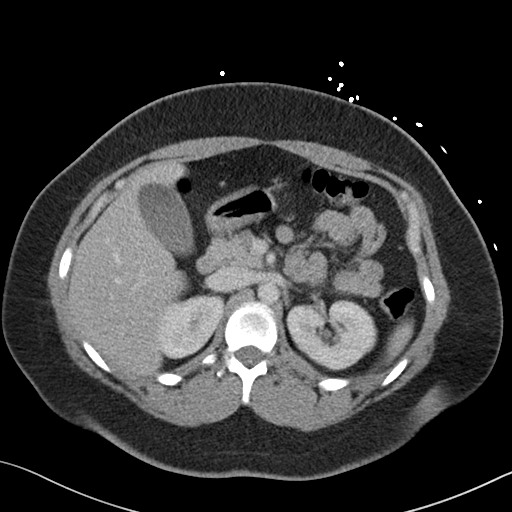
[im 74/96  soft-tissue]
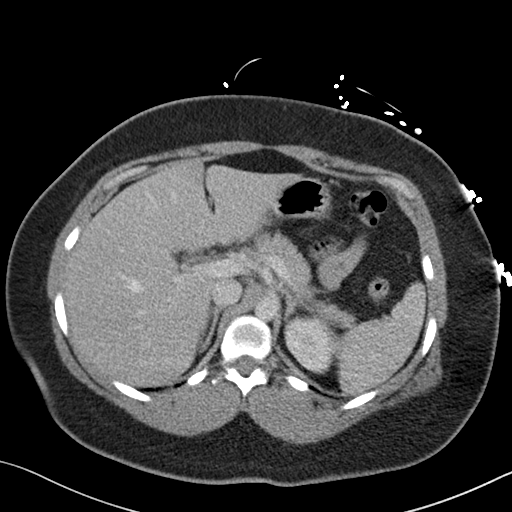
[im 85/96  soft-tissue]
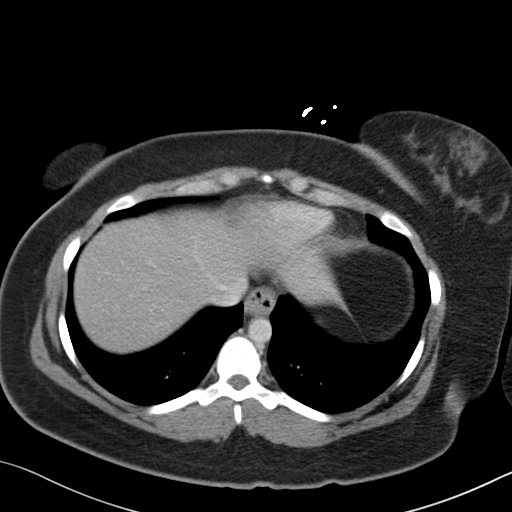
[im 90/96  soft-tissue]
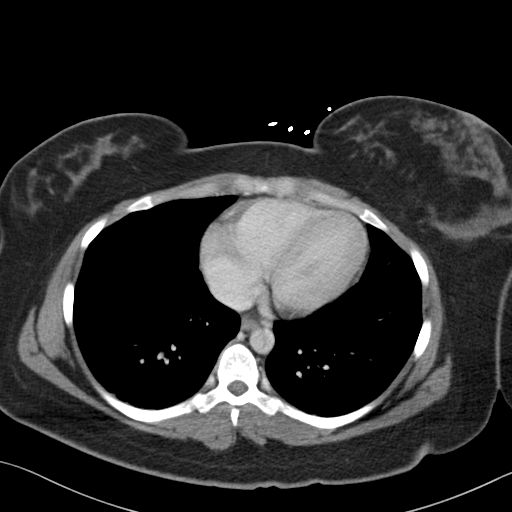

[Series 7: cor 2 · coronal · 0.95mm/px · 3 of 62 slices shown]
[im 21/62  soft-tissue]
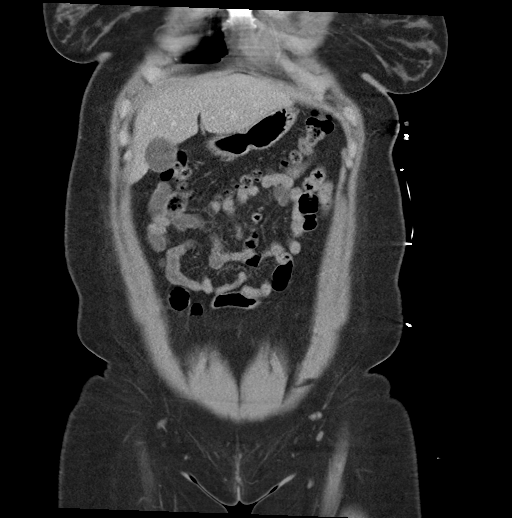
[im 28/62  soft-tissue]
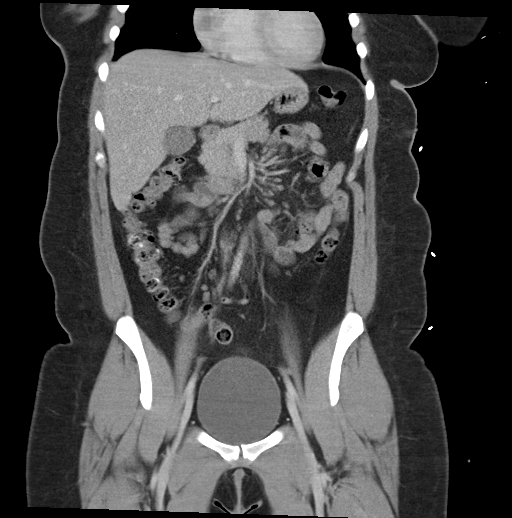
[im 34/62  soft-tissue]
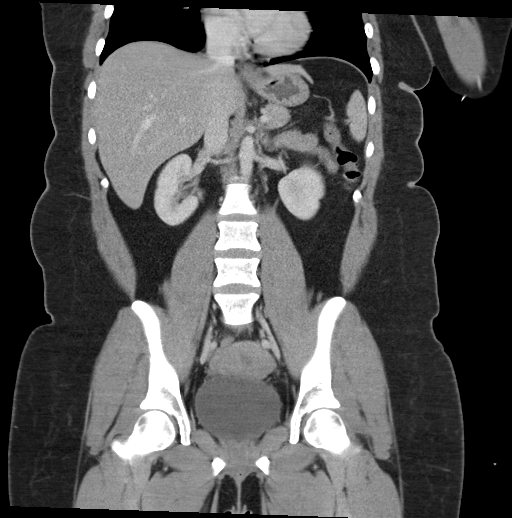

[17 of 46 positions shown; findings below may reference images not displayed]

FINDINGS: Lower chest: The visualized lung bases are clear.

No intra-abdominal free air. Small free fluid in the pelvis.

Hepatobiliary: No focal liver abnormality is seen. No gallstones,
gallbladder wall thickening, or biliary dilatation.

Pancreas: Unremarkable. No pancreatic ductal dilatation or
surrounding inflammatory changes.

Spleen: Normal in size without focal abnormality.

Adrenals/Urinary Tract: The adrenal glands, kidneys, visualized
ureters, and urinary bladder appear unremarkable.

Stomach/Bowel: There is no bowel obstruction or active inflammation.
The appendix is normal.

Vascular/Lymphatic: The abdominal aorta and IVC unremarkable. No
portal venous gas. There is no adenopathy.

Reproductive: The uterus is anteverted and grossly unremarkable. A
pessary is noted. No adnexal masses.

Other: None

Musculoskeletal: No acute or significant osseous findings.
IMPRESSION: No acute intra-abdominal or pelvic pathology. No bowel obstruction.
Normal appendix.
